# Patient Record
Sex: Male | Born: 1945 | Race: White | Hispanic: No | Marital: Married | Smoking: Never smoker
Health system: Southern US, Community
[De-identification: ages and names within clinical notes are randomized; demographics above are authoritative.]

## PROBLEM LIST (undated history)

## (undated) DIAGNOSIS — E785 Hyperlipidemia, unspecified: Secondary | ICD-10-CM

## (undated) DIAGNOSIS — M1990 Unspecified osteoarthritis, unspecified site: Secondary | ICD-10-CM

## (undated) DIAGNOSIS — I10 Essential (primary) hypertension: Secondary | ICD-10-CM

## (undated) HISTORY — DX: Essential (primary) hypertension: I10

## (undated) HISTORY — DX: Unspecified osteoarthritis, unspecified site: M19.90

## (undated) HISTORY — PX: WRIST ARTHROCENTESIS: SUR48

## (undated) HISTORY — DX: Hyperlipidemia, unspecified: E78.5

---

## 2010-02-06 ENCOUNTER — Ambulatory Visit: Payer: Self-pay | Admitting: Specialist

## 2010-02-13 ENCOUNTER — Ambulatory Visit: Payer: Self-pay | Admitting: Specialist

## 2013-10-09 DIAGNOSIS — I059 Rheumatic mitral valve disease, unspecified: Secondary | ICD-10-CM | POA: Diagnosis not present

## 2013-10-09 DIAGNOSIS — E782 Mixed hyperlipidemia: Secondary | ICD-10-CM | POA: Diagnosis not present

## 2013-10-09 DIAGNOSIS — I119 Hypertensive heart disease without heart failure: Secondary | ICD-10-CM | POA: Diagnosis not present

## 2013-10-13 DIAGNOSIS — R011 Cardiac murmur, unspecified: Secondary | ICD-10-CM | POA: Diagnosis not present

## 2013-10-13 DIAGNOSIS — R0602 Shortness of breath: Secondary | ICD-10-CM | POA: Diagnosis not present

## 2015-03-15 DIAGNOSIS — I340 Nonrheumatic mitral (valve) insufficiency: Secondary | ICD-10-CM | POA: Diagnosis not present

## 2015-03-15 DIAGNOSIS — I10 Essential (primary) hypertension: Secondary | ICD-10-CM | POA: Diagnosis not present

## 2015-03-15 DIAGNOSIS — E782 Mixed hyperlipidemia: Secondary | ICD-10-CM | POA: Diagnosis not present

## 2015-04-11 DIAGNOSIS — Z283 Underimmunization status: Secondary | ICD-10-CM | POA: Diagnosis not present

## 2015-04-11 DIAGNOSIS — R7309 Other abnormal glucose: Secondary | ICD-10-CM | POA: Diagnosis not present

## 2015-04-11 DIAGNOSIS — M5136 Other intervertebral disc degeneration, lumbar region: Secondary | ICD-10-CM | POA: Diagnosis not present

## 2015-04-11 DIAGNOSIS — M159 Polyosteoarthritis, unspecified: Secondary | ICD-10-CM | POA: Diagnosis not present

## 2015-04-11 DIAGNOSIS — I10 Essential (primary) hypertension: Secondary | ICD-10-CM | POA: Diagnosis not present

## 2015-04-11 DIAGNOSIS — Z833 Family history of diabetes mellitus: Secondary | ICD-10-CM | POA: Diagnosis not present

## 2015-04-11 DIAGNOSIS — R5381 Other malaise: Secondary | ICD-10-CM | POA: Diagnosis not present

## 2015-04-11 DIAGNOSIS — R5383 Other fatigue: Secondary | ICD-10-CM | POA: Diagnosis not present

## 2015-04-11 DIAGNOSIS — I38 Endocarditis, valve unspecified: Secondary | ICD-10-CM | POA: Diagnosis not present

## 2015-04-15 DIAGNOSIS — M5136 Other intervertebral disc degeneration, lumbar region: Secondary | ICD-10-CM | POA: Diagnosis not present

## 2015-04-15 DIAGNOSIS — R5383 Other fatigue: Secondary | ICD-10-CM | POA: Diagnosis not present

## 2015-04-15 DIAGNOSIS — R7309 Other abnormal glucose: Secondary | ICD-10-CM | POA: Diagnosis not present

## 2015-04-15 DIAGNOSIS — Z283 Underimmunization status: Secondary | ICD-10-CM | POA: Diagnosis not present

## 2015-04-15 DIAGNOSIS — I10 Essential (primary) hypertension: Secondary | ICD-10-CM | POA: Diagnosis not present

## 2015-04-15 DIAGNOSIS — R5381 Other malaise: Secondary | ICD-10-CM | POA: Diagnosis not present

## 2015-06-19 ENCOUNTER — Other Ambulatory Visit: Payer: Self-pay | Admitting: Family Medicine

## 2015-06-19 ENCOUNTER — Encounter: Payer: Self-pay | Admitting: Family Medicine

## 2015-06-19 DIAGNOSIS — M5136 Other intervertebral disc degeneration, lumbar region: Secondary | ICD-10-CM | POA: Insufficient documentation

## 2015-06-19 DIAGNOSIS — I10 Essential (primary) hypertension: Secondary | ICD-10-CM | POA: Insufficient documentation

## 2015-06-19 DIAGNOSIS — M159 Polyosteoarthritis, unspecified: Secondary | ICD-10-CM | POA: Insufficient documentation

## 2015-06-19 DIAGNOSIS — R7303 Prediabetes: Secondary | ICD-10-CM | POA: Insufficient documentation

## 2015-06-19 DIAGNOSIS — R011 Cardiac murmur, unspecified: Secondary | ICD-10-CM | POA: Insufficient documentation

## 2015-06-19 DIAGNOSIS — R5381 Other malaise: Secondary | ICD-10-CM | POA: Insufficient documentation

## 2015-06-19 DIAGNOSIS — R5383 Other fatigue: Secondary | ICD-10-CM

## 2015-08-09 DIAGNOSIS — I6523 Occlusion and stenosis of bilateral carotid arteries: Secondary | ICD-10-CM | POA: Insufficient documentation

## 2015-08-09 DIAGNOSIS — E782 Mixed hyperlipidemia: Secondary | ICD-10-CM | POA: Diagnosis not present

## 2015-08-09 DIAGNOSIS — I10 Essential (primary) hypertension: Secondary | ICD-10-CM | POA: Diagnosis not present

## 2015-08-09 DIAGNOSIS — I340 Nonrheumatic mitral (valve) insufficiency: Secondary | ICD-10-CM | POA: Diagnosis not present

## 2015-08-09 DIAGNOSIS — I952 Hypotension due to drugs: Secondary | ICD-10-CM | POA: Diagnosis not present

## 2015-09-09 DIAGNOSIS — I340 Nonrheumatic mitral (valve) insufficiency: Secondary | ICD-10-CM | POA: Diagnosis not present

## 2015-09-09 DIAGNOSIS — E782 Mixed hyperlipidemia: Secondary | ICD-10-CM | POA: Diagnosis not present

## 2015-09-09 DIAGNOSIS — I6523 Occlusion and stenosis of bilateral carotid arteries: Secondary | ICD-10-CM | POA: Diagnosis not present

## 2015-09-09 DIAGNOSIS — I10 Essential (primary) hypertension: Secondary | ICD-10-CM | POA: Diagnosis not present

## 2016-05-01 ENCOUNTER — Other Ambulatory Visit: Payer: Self-pay | Admitting: Orthopedic Surgery

## 2016-05-01 DIAGNOSIS — M25511 Pain in right shoulder: Secondary | ICD-10-CM

## 2016-05-13 ENCOUNTER — Ambulatory Visit
Admission: RE | Admit: 2016-05-13 | Discharge: 2016-05-13 | Disposition: A | Discharge status: Home / Self Care | Payer: Medicare Other | Source: Ambulatory Visit | Attending: Orthopedic Surgery | Admitting: Orthopedic Surgery

## 2016-05-13 DIAGNOSIS — M62511 Muscle wasting and atrophy, not elsewhere classified, right shoulder: Secondary | ICD-10-CM | POA: Insufficient documentation

## 2016-05-13 DIAGNOSIS — M25511 Pain in right shoulder: Secondary | ICD-10-CM

## 2016-05-13 DIAGNOSIS — M75121 Complete rotator cuff tear or rupture of right shoulder, not specified as traumatic: Secondary | ICD-10-CM | POA: Diagnosis not present

## 2016-05-13 MED ORDER — IOPAMIDOL (ISOVUE-300) INJECTION 61%: 10.0000 mL | Freq: Once | INTRAVENOUS | Status: DC | PRN

## 2016-09-19 IMAGING — RF DG ARTHROGRAM SHOULDER*R*
1 series · 2 of 2 positions shown · non-contrast
Comparison: No recent prior.

INDICATION: Right shoulder pain.

EXAM:
ARTHROGRAM OF THE RIGHT SHOULDER

[Series 1: cp_standard · 0.18mm/px · 2 of 2 slices shown]
[im 1/2]
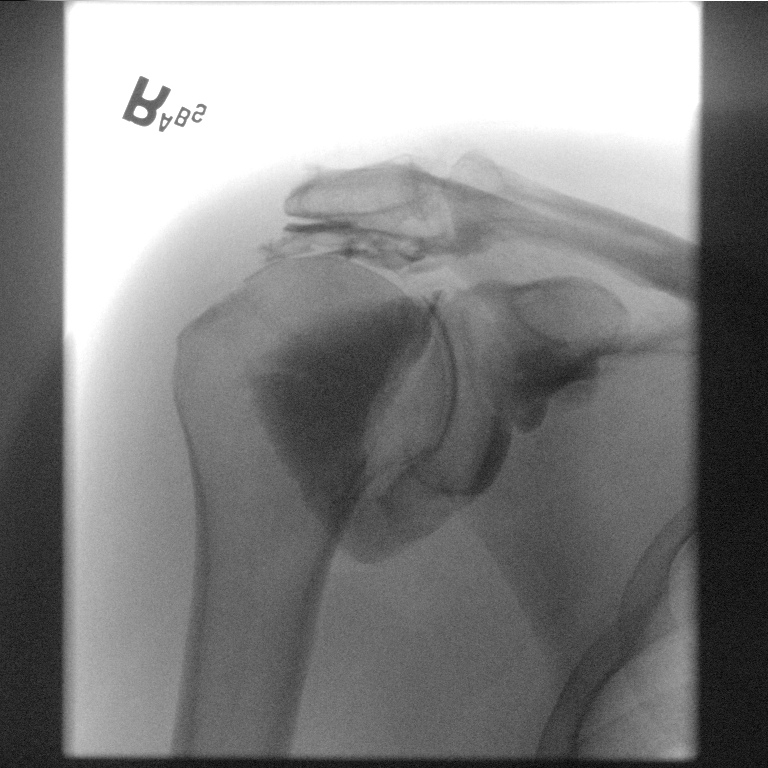
[im 2/2]
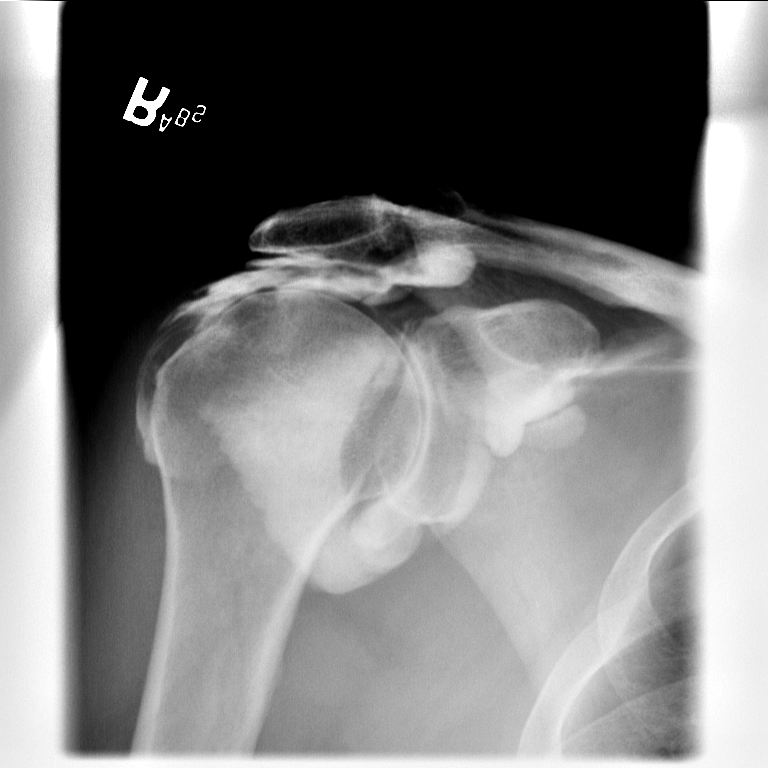

[2 of 2 positions shown; findings below may reference images not displayed]

MEDICATIONS:
None

FLUOROSCOPY TIME:  Radiation Exposure Index (as provided by the
fluoroscopic device): 8.4 mGy

PROCEDURE:
Informed written consent was obtained from the patient after
discussion of the risks, benefits and alternatives to treatment. The
patient was placed prone on the fluoroscopy table and the right
extremity was placed in external rotation. The right shoulder was
localized with fluoroscopy. The skin overlying the anterior aspect
of the shoulder was prepped and draped in usual sterile fashion. A
22 gauge spinal needle was advanced into the shoulder joint, after
the overlying soft tissues were anesthetized with 1% lidocaine.
Standard arthrogram performed with Omnipaque 370. A fluoroscopic
image was saved and sent to PACs.

COMPLICATIONS:
None immediate
FINDINGS: Fluoroscopic imaging demonstrates successful intra-articular
injection of a mixture of Bsovue-ZRR and saline.
IMPRESSION: Successful fluoroscopic guided arthrogram of the right shoulder.

## 2016-11-26 ENCOUNTER — Encounter: Payer: Self-pay | Admitting: Family Medicine

## 2016-11-26 ENCOUNTER — Ambulatory Visit (INDEPENDENT_AMBULATORY_CARE_PROVIDER_SITE_OTHER): Payer: Medicare Other | Admitting: Family Medicine

## 2016-11-26 VITALS — BP 182/84 | HR 69 | Resp 16 | Ht 61.0 in | Wt 131.0 lb

## 2016-11-26 DIAGNOSIS — R7303 Prediabetes: Secondary | ICD-10-CM

## 2016-11-26 DIAGNOSIS — M159 Polyosteoarthritis, unspecified: Secondary | ICD-10-CM

## 2016-11-26 DIAGNOSIS — I10 Essential (primary) hypertension: Secondary | ICD-10-CM | POA: Diagnosis not present

## 2016-11-26 DIAGNOSIS — D1720 Benign lipomatous neoplasm of skin and subcutaneous tissue of unspecified limb: Secondary | ICD-10-CM

## 2016-11-26 DIAGNOSIS — D1779 Benign lipomatous neoplasm of other sites: Secondary | ICD-10-CM

## 2016-11-26 DIAGNOSIS — I6523 Occlusion and stenosis of bilateral carotid arteries: Secondary | ICD-10-CM | POA: Diagnosis not present

## 2016-11-26 MED ORDER — ASPIRIN 81 MG PO TABS: 81.0000 mg | ORAL_TABLET | Freq: Every day | ORAL | Status: AC

## 2016-11-26 MED ORDER — AMLODIPINE BESYLATE 5 MG PO TABS: 5.0000 mg | ORAL_TABLET | Freq: Every day | ORAL | Status: DC

## 2016-11-26 MED ORDER — ACETAMINOPHEN 500 MG PO TABS: 1000.0000 mg | ORAL_TABLET | Freq: Three times a day (TID) | ORAL | 0 refills | Status: AC | PRN

## 2016-11-26 NOTE — Patient Instructions (Signed)
Stop ibuprofen, use Tylenol ES instead.

## 2016-11-26 NOTE — Progress Notes (Signed)
Date:  11/26/2016   Name:  Jose Houston   DOB:  1946/06/21   MRN:  [id number]  PCP:  Adline Potter, MD    Chief Complaint: Hypertension (Did not take meds today for labs. ) and Annual Exam (Medicare )   History of Present Illness:  This is a 70 y.o. male seen for f/u HTN, carotid stenosis, OA, and prediabetes. Saw cardiology in October and BP meds and statin refilled by him, no changes made. Last BP's in his office 144/82 and 140/70, runs 140's/70's at home, didn't take BP meds today. Lipids ok in March. C/o OA pain worse, ibuprofen not helping. Growth L elbow, nonpainful. Declines flu, pneumo, zoster, and tetanus imms. Normal colonoscopy 5-6 years ago per pt report.  Review of Systems:  Review of Systems  Constitutional: Negative for activity change, appetite change and fever.  Respiratory: Negative for cough and shortness of breath.   Cardiovascular: Negative for chest pain and leg swelling.  Gastrointestinal: Negative for abdominal pain.  Genitourinary: Negative for difficulty urinating.  Neurological: Negative for dizziness and syncope.    Patient Active Problem List   Diagnosis Date Noted  . Bilateral carotid artery stenosis 08/09/2015  . DDD (degenerative disc disease), lumbar 06/19/2015  . Essential (primary) hypertension 06/19/2015  . Heart murmur, systolic 99991111  . Malaise and fatigue 06/19/2015  . Generalized OA 06/19/2015  . Prediabetes 06/19/2015    Prior to Admission medications   Medication Sig Start Date End Date Taking? Authorizing Provider  atorvastatin (LIPITOR) 40 MG tablet Take by mouth. 03/09/16 03/09/17 Yes Historical Provider, MD  Glucosamine-Chondroit-Vit C-Mn (GLUCOSAMINE CHONDR 1500 COMPLX) CAPS Take 1 capsule by mouth daily.   Yes Historical Provider, MD  Multiple Vitamins-Minerals (CENTRUM SILVER ADULT 50+) TABS Take 1 tablet by mouth daily.   Yes Historical Provider, MD  olmesartan-hydrochlorothiazide (BENICAR HCT) 40-12.5 MG per tablet Take 1  tablet by mouth daily.   Yes Historical Provider, MD  acetaminophen (TYLENOL) 500 MG tablet Take 2 tablets (1,000 mg total) by mouth every 8 (eight) hours as needed. 11/26/16   Adline Potter, MD  amLODipine (NORVASC) 5 MG tablet Take 1 tablet (5 mg total) by mouth daily. 11/26/16   Adline Potter, MD  aspirin 81 MG tablet Take 1 tablet (81 mg total) by mouth daily. 11/26/16   Adline Potter, MD    Allergies no known allergies  Past Surgical History:  Procedure Laterality Date  . WRIST ARTHROCENTESIS Bilateral     Social History  Substance Use Topics  . Smoking status: Never Smoker  . Smokeless tobacco: Never Used  . Alcohol use Yes     Comment: occassion    Family History  Problem Relation Age of Onset  . Family history unknown: Yes    Medication list has been reviewed and updated.  Physical Examination: BP (!) 182/84   Pulse 69   Resp 16   Ht 5\' 1"  (1.549 m)   Wt 131 lb (59.4 kg)   SpO2 (!) 69%   BMI 24.75 kg/m   Physical Exam  Constitutional: He appears well-developed and well-nourished.  Cardiovascular: Normal rate, regular rhythm and normal heart sounds.   Pulmonary/Chest: Effort normal and breath sounds normal.  Musculoskeletal: He exhibits no edema.  Mobile soft mass L lateral elbow  Neurological: He is alert.  Skin: Skin is warm and dry.  Psychiatric: He has a normal mood and affect. His behavior is normal.  Nursing note and vitals reviewed.   Assessment and Plan:  1. Essential (  primary) hypertension Poor control today but did not take BP meds today and adequate control at home/cardiology office, continue amlodipine/Benicar HCT - Comprehensive Metabolic Panel (CMET) - CBC  2. Bilateral carotid artery stenosis Stable on asa/statin, followed by cardiology  3. Generalized OA Poor control on ibuprofen, change to Tylenol ES 1000 mg q8h prn, may also help BP control  4. Prediabetes - HgB A1c  5. Lipoma L elbow  6. HM - Hepatitis C Antibody  Return  in about 6 months (around 05/27/2017).  Satira Anis. [facility]  11/26/2016

## 2016-11-27 ENCOUNTER — Other Ambulatory Visit: Payer: Self-pay | Admitting: Family Medicine

## 2016-11-27 LAB — COMPREHENSIVE METABOLIC PANEL
ALBUMIN: 4.7 g/dL (ref 3.6–4.8)
ALK PHOS: 96 IU/L (ref 39–117)
ALT: 24 IU/L (ref 0–44)
AST: 23 IU/L (ref 0–40)
Albumin/Globulin Ratio: 2.1 (ref 1.2–2.2)
BUN / CREAT RATIO: 18 (ref 10–24)
BUN: 13 mg/dL (ref 8–27)
Bilirubin Total: 0.5 mg/dL (ref 0.0–1.2)
CALCIUM: 9.9 mg/dL (ref 8.6–10.2)
CO2: 28 mmol/L (ref 18–29)
CREATININE: 0.71 mg/dL — AB (ref 0.76–1.27)
Chloride: 97 mmol/L (ref 96–106)
GFR calc Af Amer: 111 mL/min/{1.73_m2} (ref >59)
GFR calc non Af Amer: 96 mL/min/{1.73_m2} (ref >59)
GLUCOSE: 85 mg/dL (ref 65–99)
Globulin, Total: 2.2 g/dL (ref 1.5–4.5)
Potassium: 4.3 mmol/L (ref 3.5–5.2)
Sodium: 139 mmol/L (ref 134–144)
TOTAL PROTEIN: 6.9 g/dL (ref 6.0–8.5)

## 2016-11-27 LAB — HEMOGLOBIN A1C
Est. average glucose Bld gHb Est-mCnc: 114 mg/dL
Hgb A1c MFr Bld: 5.6 % (ref 4.8–5.6)

## 2016-11-27 LAB — CBC
HEMATOCRIT: 42.3 % (ref 37.5–51.0)
Hemoglobin: 14.1 g/dL (ref 13.0–17.7)
MCH: 30.4 pg (ref 26.6–33.0)
MCHC: 33.3 g/dL (ref 31.5–35.7)
MCV: 91 fL (ref 79–97)
Platelets: 342 10*3/uL (ref 150–379)
RBC: 4.64 x10E6/uL (ref 4.14–5.80)
RDW: 12.8 % (ref 12.3–15.4)
WBC: 7.9 10*3/uL (ref 3.4–10.8)

## 2016-11-27 LAB — HEPATITIS C ANTIBODY: Hep C Virus Ab: <0.1 s/co ratio (ref 0.0–0.9)

## 2016-11-30 ENCOUNTER — Telehealth: Payer: Self-pay | Admitting: Family Medicine

## 2016-11-30 NOTE — Telephone Encounter (Signed)
Called pt about lab result are normal and Dr. Vicente Masson increase Rx. Amlodiphine 10mg  sent to Pharmacy

## 2019-03-08 DIAGNOSIS — I350 Nonrheumatic aortic (valve) stenosis: Secondary | ICD-10-CM | POA: Insufficient documentation

## 2021-11-24 ENCOUNTER — Other Ambulatory Visit: Payer: Self-pay

## 2021-11-24 ENCOUNTER — Ambulatory Visit (INDEPENDENT_AMBULATORY_CARE_PROVIDER_SITE_OTHER): Payer: PRIVATE HEALTH INSURANCE | Admitting: Family Medicine

## 2021-11-24 ENCOUNTER — Encounter: Payer: Self-pay | Admitting: Family Medicine

## 2021-11-24 VITALS — BP 130/60 | HR 72 | Ht 61.0 in | Wt 127.0 lb

## 2021-11-24 DIAGNOSIS — I6523 Occlusion and stenosis of bilateral carotid arteries: Secondary | ICD-10-CM | POA: Diagnosis not present

## 2021-11-24 DIAGNOSIS — Z7689 Persons encountering health services in other specified circumstances: Secondary | ICD-10-CM

## 2021-11-24 NOTE — Progress Notes (Signed)
Date:  11/24/2021   Name:  Jose Houston   DOB:  [birth year]   MRN:  [id number]   Chief Complaint: Establish Care  Patient is a 75 year old male who presents for a establish care exam. The patient reports the following problems: none. Health maintenance has been reviewed declines immunization update..     Lab Results  Component Value Date   NA 139 11/26/2016   K 4.3 11/26/2016   CO2 28 11/26/2016   GLUCOSE 85 11/26/2016   BUN 13 11/26/2016   CREATININE 0.71 (L) 11/26/2016   CALCIUM 9.9 11/26/2016   GFRNONAA 96 11/26/2016   No results found for: CHOL, HDL, LDLCALC, LDLDIRECT, TRIG, CHOLHDL No results found for: TSH Lab Results  Component Value Date   HGBA1C 5.6 11/26/2016   Lab Results  Component Value Date   WBC 7.9 11/26/2016   HGB 14.1 11/26/2016   HCT 42.3 11/26/2016   MCV 91 11/26/2016   PLT 342 11/26/2016   Lab Results  Component Value Date   ALT 24 11/26/2016   AST 23 11/26/2016   ALKPHOS 96 11/26/2016   BILITOT 0.5 11/26/2016   No results found for: 25OHVITD2, 25OHVITD3, VD25OH   Review of Systems  Constitutional:  Negative for chills and fever.  HENT:  Negative for drooling, ear discharge, ear pain and sore throat.   Respiratory:  Negative for cough, shortness of breath and wheezing.   Cardiovascular:  Negative for chest pain, palpitations and leg swelling.  Gastrointestinal:  Negative for abdominal pain, blood in stool, constipation, diarrhea and nausea.  Endocrine: Negative for polydipsia.  Genitourinary:  Negative for dysuria, frequency, hematuria and urgency.  Musculoskeletal:  Negative for back pain, myalgias and neck pain.  Skin:  Negative for rash.  Allergic/Immunologic: Negative for environmental allergies.  Neurological:  Negative for dizziness and headaches.  Hematological:  Does not bruise/bleed easily.  Psychiatric/Behavioral:  Negative for suicidal ideas. The patient is not nervous/anxious.    Patient Active Problem List    Diagnosis Date Noted   Bilateral carotid artery stenosis 08/09/2015   DDD (degenerative disc disease), lumbar 06/19/2015   Essential (primary) hypertension 06/19/2015   Heart murmur, systolic 26/94/8546   Malaise and fatigue 06/19/2015   Generalized OA 06/19/2015   Prediabetes 06/19/2015    No Known Allergies  Past Surgical History:  Procedure Laterality Date   WRIST ARTHROCENTESIS Bilateral     Social History   Tobacco Use   Smoking status: Never   Smokeless tobacco: Never  Substance Use Topics   Alcohol use: Yes    Comment: occassion   Drug use: No     Medication list has been reviewed and updated.  Current Meds  Medication Sig   acetaminophen (TYLENOL) 500 MG tablet Take 2 tablets (1,000 mg total) by mouth every 8 (eight) hours as needed.   amLODipine (NORVASC) 10 MG tablet Take 1 tablet by mouth daily. kowalski   aspirin 81 MG tablet Take 1 tablet (81 mg total) by mouth daily.   atorvastatin (LIPITOR) 40 MG tablet Take 40 mg by mouth. kowalski   Glucosamine-Chondroit-Vit C-Mn (GLUCOSAMINE CHONDR 1500 COMPLX) CAPS Take 1 capsule by mouth daily.   Multiple Vitamins-Minerals (CENTRUM SILVER ADULT 50+) TABS Take 1 tablet by mouth daily.   olmesartan-hydrochlorothiazide (BENICAR HCT) 40-25 MG tablet Take 1 tablet by mouth daily. kowalski    PHQ 2/9 Scores 11/24/2021 11/26/2016  PHQ - 2 Score 0 0  PHQ- 9 Score 0 -    GAD 7 :  Generalized Anxiety Score 11/24/2021  Nervous, Anxious, on Edge 0  Control/stop worrying 0  Worry too much - different things 0  Trouble relaxing 0  Restless 0  Easily annoyed or irritable 0  Afraid - awful might happen 0  Total GAD 7 Score 0    BP Readings from Last 3 Encounters:  11/24/21 130/60  11/26/16 (!) 182/84    Physical Exam Vitals and nursing note reviewed.  HENT:     Head: Normocephalic.     Right Ear: Tympanic membrane, ear canal and external ear normal. There is no impacted cerumen.     Left Ear: Tympanic membrane,  ear canal and external ear normal. There is no impacted cerumen.     Nose: Nose normal. No congestion or rhinorrhea.  Eyes:     General: No scleral icterus.       Right eye: No discharge.        Left eye: No discharge.     Conjunctiva/sclera: Conjunctivae normal.     Pupils: Pupils are equal, round, and reactive to light.  Neck:     Thyroid: No thyromegaly.     Vascular: No JVD.     Trachea: No tracheal deviation.  Cardiovascular:     Rate and Rhythm: Normal rate and regular rhythm.     Heart sounds: S1 normal and S2 normal. Murmur heard.  Systolic murmur is present with a grade of 2/6.  No diastolic murmur is present.    No friction rub. No gallop. No S3 or S4 sounds.  Pulmonary:     Effort: No respiratory distress.     Breath sounds: Normal breath sounds. No wheezing, rhonchi or rales.  Abdominal:     General: Bowel sounds are normal.     Palpations: Abdomen is soft. There is no mass.     Tenderness: There is no abdominal tenderness. There is no guarding or rebound.  Musculoskeletal:        General: No tenderness. Normal range of motion.     Cervical back: Normal range of motion and neck supple.  Lymphadenopathy:     Cervical: No cervical adenopathy.  Skin:    General: Skin is warm.     Findings: No rash.  Neurological:     Mental Status: He is alert.     Cranial Nerves: No cranial nerve deficit.    Wt Readings from Last 3 Encounters:  11/24/21 127 lb (57.6 kg)  11/26/16 131 lb (59.4 kg)    BP 130/60   Pulse 72   Ht 5\' 1"  (1.549 m)   Wt 127 lb (57.6 kg)   BMI 24.00 kg/m   Assessment and Plan:  1. Establishing care with new doctor, encounter for Patient establishing care with new physician with about current medical problems and is followed by Dr. Jose Persia for hypertension and other cardiac concerns.  2. Bilateral carotid artery stenosis Patient with history of bilateral carotid artery stenosis she is followed by Dr. Nehemiah Massed.  I have encouraged him to continue  with ASA prophylaxis.

## 2022-11-25 ENCOUNTER — Ambulatory Visit: Payer: Medicare Other | Admitting: Family Medicine

## 2024-05-23 ENCOUNTER — Ambulatory Visit: Payer: PRIVATE HEALTH INSURANCE | Admitting: Cardiovascular Disease

## 2024-07-26 ENCOUNTER — Ambulatory Visit: Payer: PRIVATE HEALTH INSURANCE | Attending: Cardiovascular Disease | Admitting: Cardiovascular Disease

## 2024-07-26 ENCOUNTER — Encounter: Payer: Self-pay | Admitting: *Deleted

## 2024-07-26 NOTE — Progress Notes (Deleted)
 Cardiology Office Note   Date:  07/26/2024   ID:  Jose Houston, DOB [birth year], MRN [id number]  PCP:  Joshua Cathryne BROCKS, MD (Inactive)  Cardiologist:   Deatrice Cage, MD   No chief complaint on file.     History of Present Illness: Jose Houston is a 78 y.o. male who presents to establish cardiovascular care.  He was previously followed by Dr. Hester. He has known history of mild aortic stenosis, essential hypertension, moderate mitral regurgitation, hyperlipidemia and moderate left carotid stenosis.  Most recent echocardiogram in 2022 showed normal LV systolic function with mild aortic stenosis and mild mitral regurgitation.  Mean gradient was 18 mmHg.  Most recent carotid Doppler was in 2022.  Past Medical History:  Diagnosis Date   Arthritis    Hyperlipidemia    Hypertension     Past Surgical History:  Procedure Laterality Date   WRIST ARTHROCENTESIS Bilateral      Current Outpatient Medications  Medication Sig Dispense Refill   acetaminophen  (TYLENOL ) 500 MG tablet Take 2 tablets (1,000 mg total) by mouth every 8 (eight) hours as needed. 30 tablet 0   amLODipine  (NORVASC ) 10 MG tablet Take 1 tablet by mouth daily. kowalski     aspirin  81 MG tablet Take 1 tablet (81 mg total) by mouth daily. 30 tablet    atorvastatin (LIPITOR) 40 MG tablet Take 40 mg by mouth. kowalski     Glucosamine-Chondroit-Vit C-Mn (GLUCOSAMINE CHONDR 1500 COMPLX) CAPS Take 1 capsule by mouth daily.     Multiple Vitamins-Minerals (CENTRUM SILVER ADULT 50+) TABS Take 1 tablet by mouth daily.     olmesartan-hydrochlorothiazide (BENICAR HCT) 40-25 MG tablet Take 1 tablet by mouth daily. kowalski     No current facility-administered medications for this visit.    Allergies:   Patient has no known allergies.    Social History:  The patient  reports that he has never smoked. He has never used smokeless tobacco. He reports current alcohol use. He reports that he does not use drugs.   Family  History:  The patient's ***Family history is unknown by patient.    ROS:  Please see the history of present illness.   Otherwise, review of systems are positive for {NONE DEFAULTED:18576}.   All other systems are reviewed and negative.    PHYSICAL EXAM: VS:  There were no vitals taken for this visit. , BMI There is no height or weight on file to calculate BMI. GEN: Well nourished, well developed, in no acute distress  HEENT: normal  Neck: no JVD, carotid bruits, or masses Cardiac: ***RRR; no murmurs, rubs, or gallops,no edema  Respiratory:  clear to auscultation bilaterally, normal work of breathing GI: soft, nontender, nondistended, + BS MS: no deformity or atrophy  Skin: warm and dry, no rash Neuro:  Strength and sensation are intact Psych: euthymic mood, full affect   EKG:  EKG {ACTION; IS/IS [id number]} ordered today. The ekg ordered today demonstrates ***   Recent Labs: No results found for requested labs within last 365 days.    Lipid Panel No results found for: CHOL, TRIG, HDL, CHOLHDL, VLDL, LDLCALC, LDLDIRECT    Wt Readings from Last 3 Encounters:  11/24/21 127 lb (57.6 kg)  11/26/16 131 lb (59.4 kg)      Other studies Reviewed: Additional studies/ records that were reviewed today include: ***. Review of the above records demonstrates: ***      No data to display  ASSESSMENT AND PLAN:  1.  Aortic stenosis:  2.  Carotid artery stenosis:  3.  Essential hypertension:  4.  Hyperlipidemia:    Disposition:   FU with *** in {gen number [id number]} {Days to years:10300}  Signed,  Deatrice Cage, MD  07/26/2024 8:50 AM    [facility]

## 2024-08-29 ENCOUNTER — Ambulatory Visit: Attending: Cardiovascular Disease | Admitting: Cardiovascular Disease
